# Patient Record
Sex: Female | Born: 1985 | Race: Black or African American | Hispanic: No | Marital: Single | Smoking: Current every day smoker
Health system: Southern US, Community
[De-identification: ages and names within clinical notes are randomized; demographics above are authoritative.]

## PROBLEM LIST (undated history)

## (undated) DIAGNOSIS — D649 Anemia, unspecified: Secondary | ICD-10-CM

## (undated) HISTORY — DX: Anemia, unspecified: D64.9

---

## 2011-12-03 ENCOUNTER — Emergency Department: Payer: Self-pay | Admitting: *Deleted

## 2011-12-07 ENCOUNTER — Ambulatory Visit: Payer: Self-pay | Admitting: Family Medicine

## 2012-10-30 LAB — HM PAP SMEAR: HM Pap smear: NEGATIVE

## 2012-12-18 ENCOUNTER — Emergency Department: Payer: Self-pay | Admitting: Emergency Medicine

## 2012-12-18 LAB — COMPREHENSIVE METABOLIC PANEL
Albumin: 3.6 g/dL (ref 3.4–5.0)
Alkaline Phosphatase: 47 U/L — ABNORMAL LOW (ref 50–136)
Anion Gap: 5 — ABNORMAL LOW (ref 7–16)
BUN: 12 mg/dL (ref 7–18)
Bilirubin,Total: 0.4 mg/dL (ref 0.2–1.0)
Chloride: 107 mmol/L (ref 98–107)
Co2: 25 mmol/L (ref 21–32)
Creatinine: 0.93 mg/dL (ref 0.60–1.30)
EGFR (Non-African Amer.): >60
Osmolality: 273 (ref 275–301)
Potassium: 3.5 mmol/L (ref 3.5–5.1)
SGOT(AST): 20 U/L (ref 15–37)
SGPT (ALT): 29 U/L (ref 12–78)
Sodium: 137 mmol/L (ref 136–145)
Total Protein: 7.1 g/dL (ref 6.4–8.2)

## 2012-12-18 LAB — CBC
HCT: 35.5 % (ref 35.0–47.0)
HGB: 11.7 g/dL — ABNORMAL LOW (ref 12.0–16.0)
MCV: 72 fL — ABNORMAL LOW (ref 80–100)
Platelet: 214 10*3/uL (ref 150–440)
RBC: 4.95 10*6/uL (ref 3.80–5.20)

## 2012-12-18 LAB — CK TOTAL AND CKMB (NOT AT ARMC): CK, Total: 168 U/L (ref 21–215)

## 2012-12-18 LAB — TROPONIN I: Troponin-I: <0.02 ng/mL

## 2013-03-01 ENCOUNTER — Emergency Department: Payer: Self-pay | Admitting: Emergency Medicine

## 2013-04-29 ENCOUNTER — Emergency Department: Payer: Self-pay | Admitting: Emergency Medicine

## 2013-04-29 LAB — URINALYSIS, COMPLETE
Bilirubin,UR: NEGATIVE
Blood: NEGATIVE
Glucose,UR: NEGATIVE mg/dL (ref 0–75)
Hyaline Cast: 5
Ph: 5 (ref 4.5–8.0)
Protein: NEGATIVE
RBC,UR: 3 /HPF (ref 0–5)
Squamous Epithelial: 3
WBC UR: 23 /HPF (ref 0–5)

## 2013-04-29 LAB — BASIC METABOLIC PANEL
BUN: 12 mg/dL (ref 7–18)
Calcium, Total: 9.8 mg/dL (ref 8.5–10.1)
Chloride: 107 mmol/L (ref 98–107)
Co2: 27 mmol/L (ref 21–32)
EGFR (African American): >60
EGFR (Non-African Amer.): >60
Osmolality: 280 (ref 275–301)
Potassium: 3.3 mmol/L — ABNORMAL LOW (ref 3.5–5.1)
Sodium: 141 mmol/L (ref 136–145)

## 2013-04-29 LAB — CBC
MCHC: 33.2 g/dL (ref 32.0–36.0)
MCV: 73 fL — ABNORMAL LOW (ref 80–100)
WBC: 8.2 10*3/uL (ref 3.6–11.0)

## 2013-04-29 LAB — HCG, QUANTITATIVE, PREGNANCY: Beta Hcg, Quant.: <1 m[IU]/mL — ABNORMAL LOW

## 2013-04-29 LAB — WET PREP, GENITAL

## 2014-04-02 LAB — HM HIV SCREENING LAB: HM HIV Screening: NEGATIVE

## 2014-04-21 ENCOUNTER — Encounter: Payer: Self-pay | Admitting: Maternal & Fetal Medicine

## 2014-06-05 ENCOUNTER — Encounter: Payer: Self-pay | Admitting: Obstetrics & Gynecology

## 2014-08-10 ENCOUNTER — Observation Stay: Payer: Self-pay | Admitting: Obstetrics and Gynecology

## 2014-08-10 LAB — PIH PROFILE
Anion Gap: 9 (ref 7–16)
BUN: 8 mg/dL (ref 7–18)
Calcium, Total: 8.8 mg/dL (ref 8.5–10.1)
Chloride: 105 mmol/L (ref 98–107)
Co2: 23 mmol/L (ref 21–32)
Creatinine: 0.59 mg/dL — ABNORMAL LOW (ref 0.60–1.30)
EGFR (African American): >60
EGFR (Non-African Amer.): >60
Glucose: 159 mg/dL — ABNORMAL HIGH (ref 65–99)
HCT: 32.6 % — ABNORMAL LOW (ref 35.0–47.0)
HGB: 10.6 g/dL — ABNORMAL LOW (ref 12.0–16.0)
MCH: 23.5 pg — ABNORMAL LOW (ref 26.0–34.0)
MCHC: 32.4 g/dL (ref 32.0–36.0)
MCV: 73 fL — ABNORMAL LOW (ref 80–100)
Osmolality: 276 (ref 275–301)
Platelet: 242 10*3/uL (ref 150–440)
Potassium: 3.5 mmol/L (ref 3.5–5.1)
RBC: 4.5 10*6/uL (ref 3.80–5.20)
RDW: 15 % — ABNORMAL HIGH (ref 11.5–14.5)
SGOT(AST): 15 U/L (ref 15–37)
Sodium: 137 mmol/L (ref 136–145)
Uric Acid: 2.8 mg/dL (ref 2.6–6.0)
WBC: 11.3 10*3/uL — ABNORMAL HIGH (ref 3.6–11.0)

## 2014-09-09 ENCOUNTER — Observation Stay: Payer: Self-pay | Admitting: Obstetrics and Gynecology

## 2014-09-09 LAB — URINALYSIS, COMPLETE
BACTERIA: NONE SEEN
BILIRUBIN, UR: NEGATIVE
Glucose,UR: >=500 mg/dL (ref 0–75)
Leukocyte Esterase: NEGATIVE
NITRITE: NEGATIVE
Ph: 5 (ref 4.5–8.0)
Protein: 100
Specific Gravity: 1.026 (ref 1.003–1.030)
Squamous Epithelial: 2
WBC UR: 5 /HPF (ref 0–5)

## 2014-09-09 LAB — COMPREHENSIVE METABOLIC PANEL
ALK PHOS: 189 U/L — AB
ALT: 15 U/L
AST: 17 U/L (ref 15–37)
Albumin: 2.8 g/dL — ABNORMAL LOW (ref 3.4–5.0)
Anion Gap: 18 — ABNORMAL HIGH (ref 7–16)
BILIRUBIN TOTAL: 0.4 mg/dL (ref 0.2–1.0)
BUN: 9 mg/dL (ref 7–18)
Calcium, Total: 9.9 mg/dL (ref 8.5–10.1)
Chloride: 100 mmol/L (ref 98–107)
Co2: 16 mmol/L — ABNORMAL LOW (ref 21–32)
Creatinine: 1.14 mg/dL (ref 0.60–1.30)
EGFR (African American): >60
EGFR (Non-African Amer.): >60
GLUCOSE: 427 mg/dL — AB (ref 65–99)
OSMOLALITY: 285 (ref 275–301)
Potassium: 3.7 mmol/L (ref 3.5–5.1)
Sodium: 134 mmol/L — ABNORMAL LOW (ref 136–145)
TOTAL PROTEIN: 7.9 g/dL (ref 6.4–8.2)

## 2014-09-09 LAB — CBC WITH DIFFERENTIAL/PLATELET
BASOS PCT: 0.2 %
Basophil #: 0 10*3/uL (ref 0.0–0.1)
EOS PCT: 0 %
Eosinophil #: 0 10*3/uL (ref 0.0–0.7)
HCT: 37.4 % (ref 35.0–47.0)
HGB: 12 g/dL (ref 12.0–16.0)
LYMPHS PCT: 8 %
Lymphocyte #: 0.9 10*3/uL — ABNORMAL LOW (ref 1.0–3.6)
MCH: 22.9 pg — AB (ref 26.0–34.0)
MCHC: 32.1 g/dL (ref 32.0–36.0)
MCV: 71 fL — AB (ref 80–100)
MONOS PCT: 5.3 %
Monocyte #: 0.6 x10 3/mm (ref 0.2–0.9)
Neutrophil #: 10.2 10*3/uL — ABNORMAL HIGH (ref 1.4–6.5)
Neutrophil %: 86.5 %
PLATELETS: 229 10*3/uL (ref 150–440)
RBC: 5.25 10*6/uL — AB (ref 3.80–5.20)
RDW: 14.9 % — ABNORMAL HIGH (ref 11.5–14.5)
WBC: 11.8 10*3/uL — ABNORMAL HIGH (ref 3.6–11.0)

## 2014-09-09 LAB — HEMOGLOBIN A1C: HEMOGLOBIN A1C: 8.8 % — AB (ref 4.2–6.3)

## 2014-09-09 LAB — LIPASE, BLOOD: Lipase: 106 U/L (ref 73–393)

## 2014-09-09 LAB — AMYLASE: Amylase: 46 U/L (ref 25–115)

## 2014-09-09 LAB — BETA-HYDROXYBUTYRIC ACID: Beta-Hydroxybutyrate: 43.3 mg/dL — ABNORMAL HIGH (ref 0.2–2.8)

## 2014-09-10 LAB — BASIC METABOLIC PANEL
Anion Gap: 13 (ref 7–16)
BUN: 7 mg/dL (ref 7–18)
CHLORIDE: 108 mmol/L — AB (ref 98–107)
CO2: 15 mmol/L — AB (ref 21–32)
Calcium, Total: 8.8 mg/dL (ref 8.5–10.1)
Creatinine: 0.8 mg/dL (ref 0.60–1.30)
EGFR (African American): >60
EGFR (Non-African Amer.): >60
GLUCOSE: 286 mg/dL — AB (ref 65–99)
Osmolality: 280 (ref 275–301)
POTASSIUM: 5.3 mmol/L — AB (ref 3.5–5.1)
Sodium: 136 mmol/L (ref 136–145)

## 2014-09-10 LAB — DRUG SCREEN, URINE

## 2014-09-10 LAB — T4, FREE: Free Thyroxine: 1.31 ng/dL (ref 0.76–1.46)

## 2014-09-10 LAB — BETA-HYDROXYBUTYRIC ACID: BETA-HYDROXYBUTYRATE: 38.7 mg/dL — AB (ref 0.2–2.8)

## 2014-09-10 LAB — TSH: THYROID STIMULATING HORM: 1.06 u[IU]/mL

## 2014-09-10 LAB — POTASSIUM: POTASSIUM: 4.3 mmol/L (ref 3.5–5.1)

## 2014-09-12 LAB — URINE CULTURE

## 2014-11-24 IMAGING — CR DG CHEST 2V
1 series · 2 of 2 positions shown · non-contrast
Comparison: none

REASON FOR EXAM: right rib pain sp wrestling
COMMENTS:

[Series 1: w chest pa · 0.14mm/px · 2 of 2 slices shown]
[im 1/2]
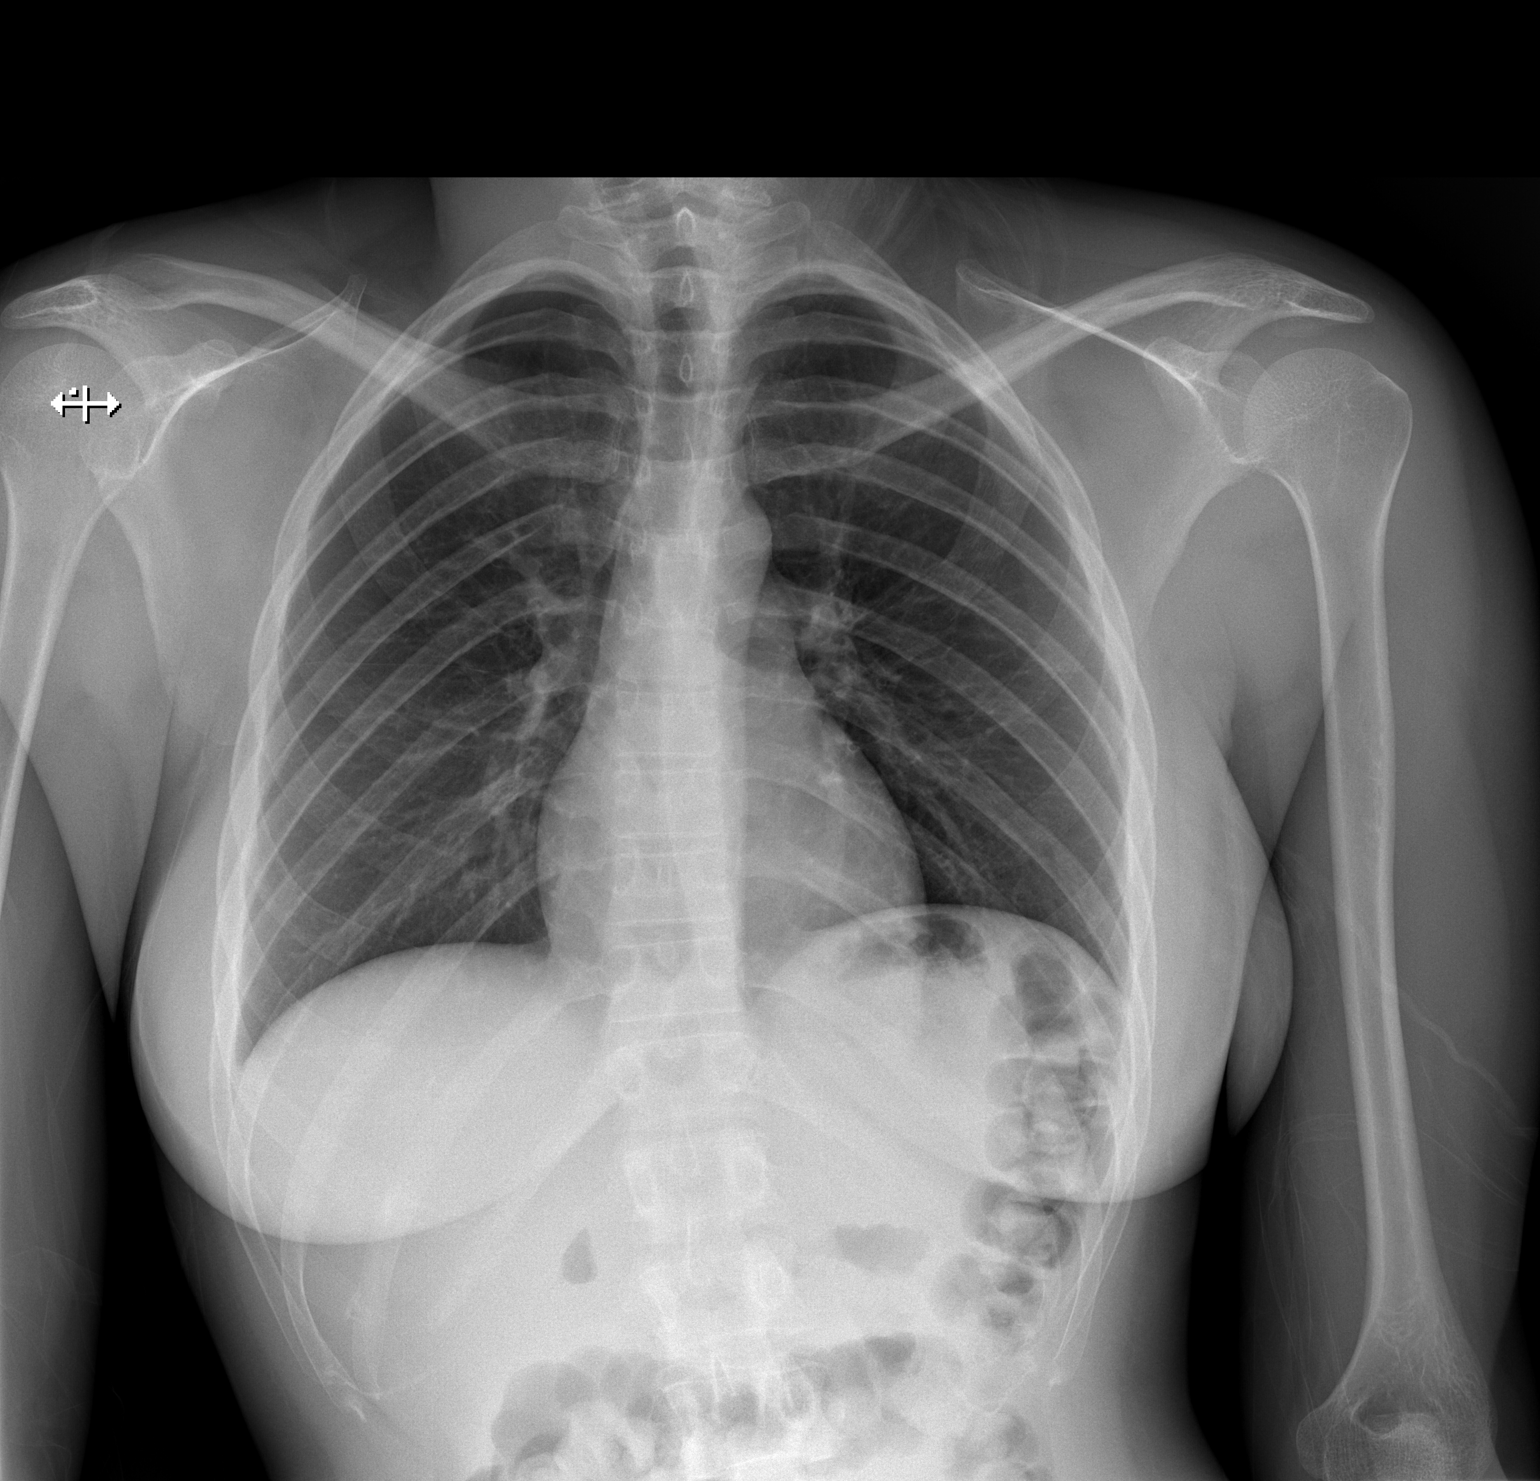
[im 2/2]
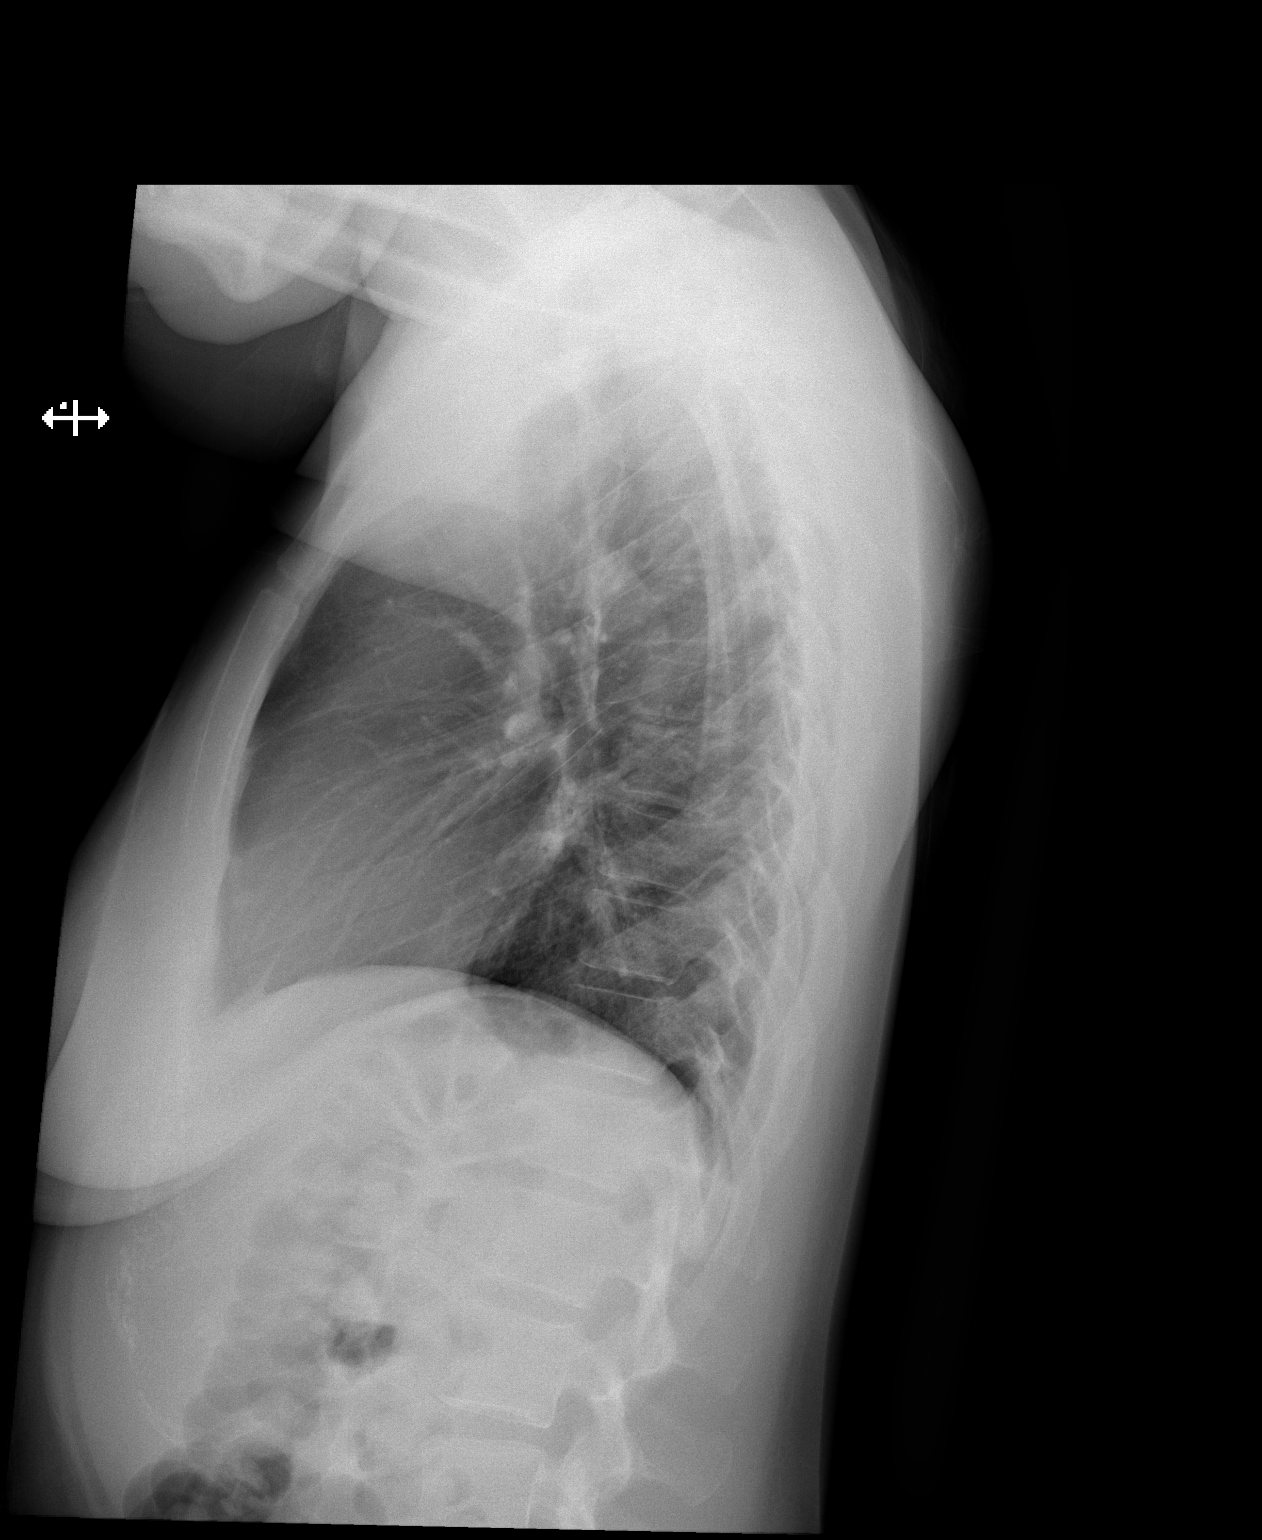

[2 of 2 positions shown; findings below may reference images not displayed]

PROCEDURE:     DXR - DXR CHEST PA (OR AP) AND LATERAL  - March 01, 2013  [DATE]

RESULT:     And there is a mild thoracic scoliosis in the upper spine
concave toward the right compensatory lower thoracic scoliosis concave to
the left. The bony structures are otherwise unremarkable. The cardiac
silhouette is normal. The lungs are clear. The mediastinum is within normal
limits.
IMPRESSION: Mild scoliosis which is unchanged. No acute cardiopulmonary
disease.

[REDACTED]
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
## 2018-09-03 ENCOUNTER — Other Ambulatory Visit: Payer: Self-pay

## 2018-09-03 ENCOUNTER — Emergency Department
Admission: EM | Admit: 2018-09-03 | Discharge: 2018-09-03 | Disposition: A | Discharge status: Home / Self Care | Payer: Medicaid Other | Attending: Emergency Medicine | Admitting: Emergency Medicine

## 2018-09-03 ENCOUNTER — Encounter: Payer: Self-pay | Admitting: Emergency Medicine

## 2018-09-03 DIAGNOSIS — S161XXA Strain of muscle, fascia and tendon at neck level, initial encounter: Secondary | ICD-10-CM | POA: Diagnosis not present

## 2018-09-03 DIAGNOSIS — F17210 Nicotine dependence, cigarettes, uncomplicated: Secondary | ICD-10-CM | POA: Diagnosis not present

## 2018-09-03 DIAGNOSIS — Y92410 Unspecified street and highway as the place of occurrence of the external cause: Secondary | ICD-10-CM | POA: Diagnosis not present

## 2018-09-03 DIAGNOSIS — S39012A Strain of muscle, fascia and tendon of lower back, initial encounter: Secondary | ICD-10-CM | POA: Insufficient documentation

## 2018-09-03 DIAGNOSIS — S199XXA Unspecified injury of neck, initial encounter: Secondary | ICD-10-CM | POA: Diagnosis present

## 2018-09-03 DIAGNOSIS — Y998 Other external cause status: Secondary | ICD-10-CM | POA: Insufficient documentation

## 2018-09-03 DIAGNOSIS — Y9389 Activity, other specified: Secondary | ICD-10-CM | POA: Insufficient documentation

## 2018-09-03 MED ORDER — MELOXICAM 15 MG PO TABS: 15.0000 mg | ORAL_TABLET | Freq: Every day | ORAL | 0 refills | Status: DC

## 2018-09-03 MED ORDER — METHOCARBAMOL 500 MG PO TABS: 500.0000 mg | ORAL_TABLET | Freq: Four times a day (QID) | ORAL | 0 refills | Status: DC
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
## 2019-03-21 ENCOUNTER — Other Ambulatory Visit: Payer: Self-pay

## 2019-03-21 ENCOUNTER — Ambulatory Visit (LOCAL_COMMUNITY_HEALTH_CENTER): Payer: Medicaid Other | Admitting: Physician Assistant

## 2019-03-21 VITALS — BP 113/75 | Wt 145.8 lb

## 2019-03-21 DIAGNOSIS — Z30013 Encounter for initial prescription of injectable contraceptive: Secondary | ICD-10-CM | POA: Diagnosis not present

## 2019-03-21 DIAGNOSIS — Z3009 Encounter for other general counseling and advice on contraception: Secondary | ICD-10-CM

## 2019-03-21 DIAGNOSIS — Z3042 Encounter for surveillance of injectable contraceptive: Secondary | ICD-10-CM

## 2019-03-21 LAB — PREGNANCY, URINE: Preg Test, Ur: NEGATIVE

## 2019-03-21 MED ORDER — MEDROXYPROGESTERONE ACETATE 150 MG/ML IM SUSP
150.0000 mg | Freq: Once | INTRAMUSCULAR | Status: AC
  Administered 2019-03-21: 150 mg via INTRAMUSCULAR

## 2019-03-21 NOTE — Progress Notes (Signed)
  Family Planning Visit- Repeat Yearly Visit  Subjective:  Maureen Hall is a 33 y.o. being seen today for an well woman visit and to discuss family planning options.    She is currently using none for pregnancy prevention. Patient reports she does not if she or her partner wants a pregnancy in the next year. Patient has the following medical conditionsdoes not have a problem list on file.  Chief Complaint  Patient presents with  . Contraception    Depo    Patient reports irreg spotting since March. Completed antibiotic for UTI within last week and all sx resolved.   Patient denies any other concerns aside from desiring contraception restart.    Does the patient desire a pregnancy in the next year? (OKQ flowsheet)  See flowsheet for other program required questions.   Body mass index is 28.47 kg/m. - Patient is eligible for diabetes screening based on BMI and age >54?  not applicable MG8Q ordered? not applicable  Patient reports 2 of partners in last year. Desires STI screening?  No - .  Does the patient have a current or past history of drug use? No   No components found for: HCV]   Health Maintenance Due  Topic Date Due  . URINE MICROALBUMIN  07/05/1997  . HIV Screening  07/05/2002  . TETANUS/TDAP  07/05/2006  . PAP SMEAR-Modifier  07/05/2008    Review of Systems  Constitutional: Negative.   Genitourinary: Negative.     The following portions of the patient's history were reviewed and updated as appropriate: allergies, current medications, past family history, past medical history, past social history, past surgical history and problem list. Problem list updated.  Objective:   Vitals:   03/21/19 0930  BP: 113/75  Weight: 145 lb 12.8 oz (66.1 kg)    Physical Exam Constitutional:      Appearance: Normal appearance. She is normal weight.  Neurological:     Mental Status: She is alert and oriented to person, place, and time.  Psychiatric:      Mood and Affect: Mood normal.        Behavior: Behavior normal.        Thought Content: Thought content normal.   Declined pelvic exam and STI testing today.    Assessment and Plan:  Maureen Hall is a 34 y.o. female presenting to the [facility] Department for a family planning visit. Last visit here 5 years ago. [id number].  Contraception counseling: Reviewed all forms of birth control options available including abstinence; over the counter/barrier methods; hormonal contraceptive medication including pill, patch, ring, injection,contraceptive implant; hormonal and nonhormonal IUDs; permanent sterilization options including vasectomy and the various tubal sterilization modalities. Risks and benefits reviewed.  Questions were answered.  Written information was also given to the patient to review.  Patient desires depo, this was prescribed for patient. She will follow up in  3 months for surveillance.  She was told to call with any further questions, or with any concerns about this method of contraception.  Emphasized use of condoms 100% of the time for STI prevention.  1. Injectable contraceptive management DMPA x1 today; pt to do home UPT in 2 wk and call/return to clinic if positive. Counseled needs next DMPA/annual exam in 3 mo.    No follow-ups on file.  No future appointments.  Lora Havens, PA-C

## 2019-03-21 NOTE — Progress Notes (Signed)
Here today for Depo injection. Reports last Depo was 08/17/2018 at [place] (31.6 weeks.) Reports last sex was 03/14/2019 without a condom. Reports intermittent vaginal spotting since 11/2018.

## 2019-04-04 DIAGNOSIS — N941 Unspecified dyspareunia: Secondary | ICD-10-CM | POA: Insufficient documentation

## 2019-04-05 ENCOUNTER — Other Ambulatory Visit: Payer: Self-pay

## 2019-04-05 ENCOUNTER — Encounter: Payer: Self-pay | Admitting: Emergency Medicine

## 2019-04-05 ENCOUNTER — Emergency Department
Admission: EM | Admit: 2019-04-05 | Discharge: 2019-04-05 | Disposition: A | Discharge status: Home / Self Care | Payer: Medicaid Other | Attending: Emergency Medicine | Admitting: Emergency Medicine

## 2019-04-05 DIAGNOSIS — N3000 Acute cystitis without hematuria: Secondary | ICD-10-CM | POA: Insufficient documentation

## 2019-04-05 DIAGNOSIS — Z202 Contact with and (suspected) exposure to infections with a predominantly sexual mode of transmission: Secondary | ICD-10-CM | POA: Insufficient documentation

## 2019-04-05 DIAGNOSIS — B9689 Other specified bacterial agents as the cause of diseases classified elsewhere: Secondary | ICD-10-CM

## 2019-04-05 DIAGNOSIS — F17210 Nicotine dependence, cigarettes, uncomplicated: Secondary | ICD-10-CM | POA: Insufficient documentation

## 2019-04-05 DIAGNOSIS — N760 Acute vaginitis: Secondary | ICD-10-CM | POA: Diagnosis not present

## 2019-04-05 DIAGNOSIS — R300 Dysuria: Secondary | ICD-10-CM | POA: Diagnosis present

## 2019-04-05 LAB — WET PREP, GENITAL
Sperm: NONE SEEN
Trich, Wet Prep: NONE SEEN
Yeast Wet Prep HPF POC: NONE SEEN

## 2019-04-05 LAB — POCT PREGNANCY, URINE: Preg Test, Ur: NEGATIVE

## 2019-04-05 LAB — URINALYSIS, ROUTINE W REFLEX MICROSCOPIC
Bilirubin Urine: NEGATIVE
Glucose, UA: NEGATIVE mg/dL
Hgb urine dipstick: NEGATIVE
Ketones, ur: NEGATIVE mg/dL
Nitrite: POSITIVE — AB
Protein, ur: 30 mg/dL — AB
Specific Gravity, Urine: 1.031 — ABNORMAL HIGH (ref 1.005–1.030)
pH: 5 (ref 5.0–8.0)

## 2019-04-05 MED ORDER — CEFTRIAXONE SODIUM 250 MG IJ SOLR
250.0000 mg | Freq: Once | INTRAMUSCULAR | Status: AC
  Administered 2019-04-05: 250 mg via INTRAMUSCULAR
  Filled 2019-04-05: qty 250

## 2019-04-05 MED ORDER — AZITHROMYCIN 1 G PO PACK
1.0000 g | PACK | Freq: Once | ORAL | Status: AC
  Administered 2019-04-05: 1 g via ORAL
  Filled 2019-04-05: qty 1

## 2019-04-05 MED ORDER — CEPHALEXIN 500 MG PO CAPS: 500.0000 mg | ORAL_CAPSULE | Freq: Two times a day (BID) | ORAL | 0 refills | Status: AC

## 2019-04-05 MED ORDER — METRONIDAZOLE 500 MG PO TABS: 500.0000 mg | ORAL_TABLET | Freq: Two times a day (BID) | ORAL | 0 refills | Status: AC

## 2019-04-05 NOTE — ED Notes (Signed)
See triage note  Presents stating that her sexual partner was positive for chlamydia

## 2019-04-05 NOTE — ED Triage Notes (Signed)
Pt to ED with c/o of vaginal discharge and burning with urination.

## 2019-04-05 NOTE — ED Notes (Signed)
See triage note  Presents  

## 2019-04-05 NOTE — Discharge Instructions (Signed)
You have been treated for exposure to chlamydia, with medicines in the ED. You will also be treated with antibiotics for BV and a UTI. Take the prescription meds as directed. Follow-up with your provider as scheduled, next week. Return to the ED sooner for worsening symptoms. You should avoid contact with all partners until all have been treated.

## 2019-04-05 NOTE — ED Provider Notes (Signed)
[facility] Emergency Department Provider Note ____________________________________________  Time seen: 1740  I have reviewed the triage vital signs and the nursing notes.  HISTORY  Chief Complaint  Exposure to STD   HPI Maureen Hall is a 33 y.o. female presents herself to the ED for management following exposure to STD.  Patient was notified today, by her boyfriend, that he was being treated for chlamydia.  Patient presents today requesting treatment at this time.  She reports some dysuria as well as vaginal discharge.  Patient denies any fevers, chills, sweats, nausea, pelvic pain or hematuria.  Patient has declined a speculum exam at this time, but has agreed to a self collected wet prep to screen for yeast, BV, trichomoniasis.  History reviewed. No pertinent past medical history.  There are no active problems to display for this patient.   Past Surgical History:  Procedure Laterality Date  . CESAREAN SECTION      Prior to Admission medications   Medication Sig Start Date End Date Taking? Authorizing Provider  cephALEXin (KEFLEX) 500 MG capsule Take 1 capsule (500 mg total) by mouth 2 (two) times daily for 7 days. 04/05/19 04/12/19  Kellis Topete, Charlesetta IvoryJenise V Bacon, PA-C  meloxicam (MOBIC) 15 MG tablet Take 1 tablet (15 mg total) by mouth daily. Patient not taking: Reported on 03/21/2019 09/03/18   Cuthriell, Delorise RoyalsJonathan D, PA-C  methocarbamol (ROBAXIN) 500 MG tablet Take 1 tablet (500 mg total) by mouth 4 (four) times daily. Patient not taking: Reported on 03/21/2019 09/03/18   Cuthriell, Delorise RoyalsJonathan D, PA-C  metroNIDAZOLE (FLAGYL) 500 MG tablet Take 1 tablet (500 mg total) by mouth 2 (two) times daily for 7 days. 04/05/19 04/12/19  Tee Richeson, Charlesetta IvoryJenise V Bacon, PA-C    Allergies Patient has no known allergies.  No family history on file.  Social History Social History   Tobacco Use  . Smoking status: Current Every Day Smoker    Packs/day: 0.50    Types:  Cigarettes  . Smokeless tobacco: Never Used  Substance Use Topics  . Alcohol use: Not Currently    Frequency: Never  . Drug use: Never    Review of Systems  Constitutional: Negative for fever. Eyes: Negative for visual changes. ENT: Negative for sore throat. Cardiovascular: Negative for chest pain. Respiratory: Negative for shortness of breath. Gastrointestinal: Negative for abdominal pain, vomiting and diarrhea. Genitourinary: Positive for dysuria and vaginal discharge. Musculoskeletal: Negative for back pain. Skin: Negative for rash. Neurological: Negative for headaches, focal weakness or numbness. ____________________________________________  PHYSICAL EXAM:  VITAL SIGNS: ED Triage Vitals  Enc Vitals Group     BP 04/05/19 1713 (!) 143/74     Pulse Rate 04/05/19 1713 90     Resp 04/05/19 1713 18     Temp 04/05/19 1713 98.7 F (37.1 C)     Temp src --      SpO2 04/05/19 1713 100 %     Weight 04/05/19 1714 146 lb (66.2 kg)     Height 04/05/19 1714 5' (1.524 m)     Head Circumference --      Peak Flow --      Pain Score 04/05/19 1714 5     Pain Loc --      Pain Edu? --      Excl. in GC? --     Constitutional: Alert and oriented. Well appearing and in no distress. Head: Normocephalic and atraumatic. Eyes: Conjunctivae are normal. Normal extraocular movements Cardiovascular: Normal rate, regular rhythm. Normal distal pulses.  Respiratory: Normal respiratory effort. No wheezes/rales/rhonchi. GU: declined by patient. Patient-collected wet prep Musculoskeletal: Nontender with normal range of motion in all extremities.  Neurologic:  Normal gait without ataxia. Normal speech and language. No gross focal neurologic deficits are appreciated. Skin:  Skin is warm, dry and intact. No rash noted. ____________________________________________   LABS (pertinent positives/negatives) Labs Reviewed  WET PREP, GENITAL - Abnormal; Notable for the following components:      Result  Value   Clue Cells Wet Prep HPF POC PRESENT (*)    WBC, Wet Prep HPF POC RARE (*)    All other components within normal limits  URINALYSIS, ROUTINE W REFLEX MICROSCOPIC - Abnormal; Notable for the following components:   Color, Urine YELLOW (*)    APPearance CLOUDY (*)    Specific Gravity, Urine 1.031 (*)    Protein, ur 30 (*)    Nitrite POSITIVE (*)    Leukocytes,Ua SMALL (*)    Bacteria, UA MANY (*)    All other components within normal limits  POC URINE PREG, ED  POCT PREGNANCY, URINE  ____________________________________________  PROCEDURES  Procedures Rocephin 250 mg IM Azithromycin 1 g PO ____________________________________________  INITIAL IMPRESSION / ASSESSMENT AND PLAN / ED COURSE  [name] was evaluated in Emergency Department on 04/05/2019 for the symptoms described in the history of present illness. She was evaluated in the context of the global COVID-19 pandemic, which necessitated consideration that the patient might be at risk for infection with the SARS-CoV-2 virus that causes COVID-19. Institutional protocols and algorithms that pertain to the evaluation of patients at risk for COVID-19 are in a state of rapid change based on information released by regulatory bodies including the CDC and federal and state organizations. These policies and algorithms were followed during the patient's care in the ED.  Patient presents to the ED for treatment following STD exposure.  She request treatment at this time, and has declined a speculum exam.  She will be treated empirically for chlamydia.  Her wet prep reveals BV and her UTI confirms cystitis. She will be treated with Flagyl and Keflex. She will follow-up with primary provider for ongoing symptoms, as scheduled next week. She is aware that her partners should be notified and treated.  ____________________________________________  FINAL CLINICAL IMPRESSION(S) / ED DIAGNOSES  Final diagnoses:  Exposure to  chlamydia  BV (bacterial vaginosis)  Acute cystitis without hematuria      Carmie End, Dannielle Karvonen, PA-C 04/05/19 [employer]    Arta Silence, MD 04/05/19 2022

## 2019-05-15 ENCOUNTER — Ambulatory Visit: Payer: Medicaid Other

## 2019-05-16 ENCOUNTER — Ambulatory Visit (LOCAL_COMMUNITY_HEALTH_CENTER): Payer: Medicaid Other

## 2019-05-16 ENCOUNTER — Other Ambulatory Visit: Payer: Self-pay

## 2019-05-16 DIAGNOSIS — Z719 Counseling, unspecified: Secondary | ICD-10-CM

## 2019-05-16 NOTE — Progress Notes (Signed)
Client presents for Depo but received 03/21/2019 at appt to initiate birth control method. Per client, she did home UPT 2 weeks after [facility] appt 03/21/2019 as instructed and test was negative.Counseled client that only 8 weeks since last Depo and earliest Depo due 06/06/2019 (reminder card given). Client also aware physical due at next Depo and reminded to ask for appt with provider if agency still not doing physicals due to Covid. Client verbalized understanding. Rich Number, RN

## 2019-06-04 ENCOUNTER — Ambulatory Visit: Payer: Medicaid Other

## 2019-06-11 ENCOUNTER — Other Ambulatory Visit: Payer: Self-pay

## 2019-06-11 ENCOUNTER — Encounter: Payer: Self-pay | Admitting: Family Medicine

## 2019-06-11 ENCOUNTER — Ambulatory Visit: Payer: Medicaid Other

## 2019-06-11 ENCOUNTER — Ambulatory Visit (LOCAL_COMMUNITY_HEALTH_CENTER): Payer: Medicaid Other | Admitting: Family Medicine

## 2019-06-11 VITALS — BP 119/75 | Ht 60.0 in | Wt 139.4 lb

## 2019-06-11 DIAGNOSIS — Z3009 Encounter for other general counseling and advice on contraception: Secondary | ICD-10-CM | POA: Diagnosis not present

## 2019-06-11 DIAGNOSIS — Z30013 Encounter for initial prescription of injectable contraceptive: Secondary | ICD-10-CM | POA: Diagnosis not present

## 2019-06-11 DIAGNOSIS — Z3042 Encounter for surveillance of injectable contraceptive: Secondary | ICD-10-CM | POA: Diagnosis not present

## 2019-06-11 MED ORDER — MEDROXYPROGESTERONE ACETATE 150 MG/ML IM SUSP
150.0000 mg | Freq: Once | INTRAMUSCULAR | Status: AC
  Administered 2019-06-11: 150 mg via INTRAMUSCULAR

## 2019-06-11 NOTE — Progress Notes (Signed)
Depo given per provider order, next Depo reminder card given and ROI faxed for last Pap results and last PE notes.Jenetta Downer, RN

## 2019-06-11 NOTE — Progress Notes (Signed)
Patient here for Depo at 11 5/7 since last Depo. Patient aware PE due. Last order was for 1 time Depo, needs new order. Depo consent signed at last visit.Marland KitchenMarland KitchenJenetta Downer, RN

## 2019-06-11 NOTE — Progress Notes (Signed)
   [place] problem visit  [place] Department  Subjective:  Maureen Hall is a 33 y.o. being seen today for birth control-  Chief Complaint  Patient presents with  . Contraception    Depo    HPI client here for continuation of Depo.   Does the patient have a current or past history of drug use? No   No components found for: HCV]   Health Maintenance Due  Topic Date Due  . URINE MICROALBUMIN  07/05/1997  . PAP SMEAR-Modifier  10/31/2015  . INFLUENZA VACCINE  04/13/2019    ROS  The following portions of the patient's history were reviewed and updated as appropriate: allergies, current medications, past family history, past medical history, past social history, past surgical history and problem list. Problem list updated.   See flowsheet for other program required questions.  Objective:   Vitals:   06/11/19 1328  BP: 119/75  Weight: 139 lb 6.4 oz (63.2 kg)  Height: 5' (1.524 m)    Physical Exam not done at this time d/t to Covid 19 restrictions.    Assessment and Plan:  Maureen Hall is a 33 y.o. female presenting to the [facility] Department for a Women's Health problem visit  1. General counseling and advice on contraceptive management Please have client sign for ROI for most recent PE and pap from [facility] Health Dept.  If pap > 3 yrs., recommend to do when she returns for her next Depo  2. Surveillance for Depo-Provera contraception  - medroxyPROGESTERone (DEPO-PROVERA) injection 150 mg x1 Co. Client that when makes appt for her next Depo to ask if clinic is performing PE.   No follow-ups on file.  No future appointments.  Hassell Done, FNP

## 2019-08-29 ENCOUNTER — Ambulatory Visit: Payer: Medicaid Other

## 2019-11-01 ENCOUNTER — Telehealth: Payer: Self-pay | Admitting: Family Medicine

## 2019-11-05 NOTE — Telephone Encounter (Signed)
Pt is interested in becoming pregnant. Pt counseled that it has been 21.0 weeks since her last depo at [facility]. Pt is aware that depo is no longer protecting her against pregnancy. Pt states she is taking PNV. Pt requests provider visit, wants counseling about Natural Family Planning. Pt scheduled for 11/08/2019 provider visit.

## 2019-11-08 ENCOUNTER — Other Ambulatory Visit: Payer: Self-pay

## 2019-11-08 ENCOUNTER — Encounter: Payer: Self-pay | Admitting: Family Medicine

## 2019-11-08 ENCOUNTER — Ambulatory Visit (LOCAL_COMMUNITY_HEALTH_CENTER): Payer: Medicaid Other | Admitting: Family Medicine

## 2019-11-08 VITALS — BP 119/71 | Ht 60.0 in | Wt 148.6 lb

## 2019-11-08 DIAGNOSIS — Z309 Encounter for contraceptive management, unspecified: Secondary | ICD-10-CM

## 2019-11-08 DIAGNOSIS — Z3169 Encounter for other general counseling and advice on procreation: Secondary | ICD-10-CM | POA: Diagnosis not present

## 2019-11-08 NOTE — Progress Notes (Signed)
Patient states last PE and Pap was at [facility] Department. ROI refaxed (originally faxed 06/11/2019 with no results received). Burt Knack, RN

## 2019-11-08 NOTE — Progress Notes (Signed)
Family Planning Visit  Subjective:  Maureen Hall is a 34 y.o. being seen today for  Chief Complaint  Patient presents with  . Amenorrhea    wants pregnancy    Pt has Dyspareunia in female on their problem list.  HPI  Patient is here with her partner and reports she is hoping to become pregnant. She stopped Depo last September and has not gotten her period back yet.  She drinks alcohol <1x/month. She does not use drugs. She smokes 2 pks of Black and Milds daily. She is taking a PNV, takes no other daily medications. She has been pregnant before and has children.     Patient's last menstrual period was 06/07/2019 (approximate).  Last pap: at [place], unknown results  Patient reports 1 partner(s) in last year. Do they desire STI screening (if no, why not)? no  Does the patient desire a pregnancy in the next year? yes   34 y.o., Body mass index is 29.02 kg/m. - Is patient eligible for HA1C diabetes screening based on BMI and age >51?  no  Does the patient have a current or past history of drug use? no No components found for: HCV  See flowsheet for other program required questions.   Health Maintenance Due  Topic Date Due  . PAP SMEAR-Modifier  10/31/2015  . INFLUENZA VACCINE  04/13/2019    ROS  The following portions of the patient's history were reviewed and updated as appropriate: allergies, current medications, past family history, past medical history, past social history, past surgical history and problem list. Problem list updated.  Objective:  BP 119/71   Ht 5' (1.524 m)   Wt 148 lb 9.6 oz (67.4 kg)   LMP 06/07/2019 (Approximate)   BMI 29.02 kg/m    Physical Exam  Gen: well appearing, NAD HEENT: no scleral icterus Lung: Normal WOB Ext: well perfused, no edema    Assessment and Plan:  Maureen Hall is a 34 y.o. female presenting to the [facility] Department for a well woman exam/family planning  visit  Contraception counseling: Reviewed all forms of birth control options in the tiered based approach including abstinence; over the counter/barrier methods; hormonal contraceptive medication including pill, patch, ring, injection, contraceptive implant; hormonal and nonhormonal IUDs; permanent sterilization options including vasectomy and the various tubal sterilization modalities. Risks, benefits, how to discontinue and typical effectiveness rates were reviewed.  Questions were answered.  Written information was also given to the patient to review.  Patient desires no BCM- desires pregnancy. She will follow up in 1 year for surveillance.  She was told to call with any further questions, or with any concerns about this method of contraception.  Emphasized use of condoms 100% of the time for STI prevention.  Emergency Contraception: n/a  1. Encounter for preconception consultation -Discussed delay of fertility after Depo cessation is common and that 50% of pts will achieve pregnancy at 10 months post last Depo injection, many others within 18 months. Advised to f/u with PCP at 12-18 mo post last depo injection if she has not yet conceived.  -ROI faxed to [place] for pap results.  -Preconception counseling today: -Advised to begin taking PNV w/folic acid.  -Encouraged regular exercise and healthy diet w/plenty of fruits and vegetables.  -We reviewed their current problems and medications in terms of pregnancy safety.  -Immunizations up to date: MMR completed, pt states she has had chicken pox as a child.  -We discussed fertility awareness,  when to take a pregnancy test, and general early pregnancy precautions.   -Reviewed services at the office regarding prenatal care.     Return if symptoms worsen or fail to improve.  No future appointments.  Kandee Keen, PA-C

## 2019-11-08 NOTE — Progress Notes (Signed)
Patient here stating she wants to be pregnant. Last Depo 06/11/2019, and patient has not had menstrual period since 05/2019. States she has taken home PT and was negative. Wants something to help bring on her period/help her become pregnant.Burt Knack, RN

## 2019-12-03 ENCOUNTER — Encounter: Payer: Self-pay | Admitting: Physician Assistant

## 2019-12-03 LAB — HM PAP SMEAR

## 2019-12-31 ENCOUNTER — Telehealth: Payer: Self-pay | Admitting: Family Medicine

## 2019-12-31 NOTE — Telephone Encounter (Signed)
patient would like to speak to a nurse regarding possible pregnancy, but not a [facility] patient here.

## 2020-01-01 NOTE — Telephone Encounter (Signed)
TC to patient who states she stopped Depo in 08/2019 and is trying to get pregnant, having cramping. States she had a menstrual period starting 12/20/19 and ending 12/26/19, took an ovulation test 2 days later and thinks she might be pregnant. Patient states she has not done a pregnancy test and that a pregnancy test would not be accurate until at least 2 weeks after pregnancy occurs. Also counseled that we cannot see her in [facility] clinic unless she is 11-[redacted] weeks pregnant, and that if her pain continues and/or heavy bleeding occurs she should seek care with her PCP or at an urgent care. Patient states understanding and denies further questions.Burt Knack, RN

## 2020-02-04 ENCOUNTER — Encounter: Payer: Self-pay | Admitting: Physician Assistant

## 2020-02-04 ENCOUNTER — Ambulatory Visit: Payer: Self-pay | Admitting: Physician Assistant

## 2020-02-04 ENCOUNTER — Other Ambulatory Visit: Payer: Self-pay

## 2020-02-04 DIAGNOSIS — Z5321 Procedure and treatment not carried out due to patient leaving prior to being seen by health care provider: Secondary | ICD-10-CM

## 2020-02-04 NOTE — Progress Notes (Signed)
Provider reported that pt was not in the room when she went in to see pt. Unable to find pt in the health dept. This RN attempted to call pt x2, but call was disconnected by the receiver prior to being able to leave a voicemail. Pt left without being seen by provider.Lyman Speller, RN

## 2020-02-04 NOTE — Progress Notes (Signed)
Pt here for STD screening.Aritza Brunet, RN 

## 2020-02-04 NOTE — Progress Notes (Signed)
Patient on schedule for 2pm IS visit.  Patient arrived at about 1:25pm and was in exam room.  Patients that had appointments prior to 2 pm, also arrived on time and were seen in appointment order.  Patient not in room when went in to see her at 2:10p and when rechecked at 2:15pm and 2:20pm.  RN checked in public restroom and patient not present.  Left without being seen.

## 2020-02-11 ENCOUNTER — Telehealth: Payer: Self-pay | Admitting: Family Medicine

## 2020-02-11 NOTE — Telephone Encounter (Signed)
Patient has medical questions regarding flu like symptoms.

## 2020-02-12 NOTE — Telephone Encounter (Signed)
Call to client and per recorded message, voicemail box is full. Raquelle Pietro, RN  

## 2020-02-13 NOTE — Telephone Encounter (Signed)
Phone call to pt. Mailbox full, unable to leave message.

## 2020-02-14 NOTE — Telephone Encounter (Signed)
Phone call to pt. Mailbox is full, unable to leave message.
